# Patient Record
Sex: Female | Born: 1960 | Race: White | Hispanic: No | Marital: Married | Smoking: Never smoker
Health system: Southern US, Community
[De-identification: ages and names within clinical notes are randomized; demographics above are authoritative.]

## PROBLEM LIST (undated history)

## (undated) DIAGNOSIS — E2681 Bartter's syndrome: Secondary | ICD-10-CM

## (undated) DIAGNOSIS — K219 Gastro-esophageal reflux disease without esophagitis: Secondary | ICD-10-CM

## (undated) DIAGNOSIS — E119 Type 2 diabetes mellitus without complications: Secondary | ICD-10-CM

## (undated) DIAGNOSIS — G4700 Insomnia, unspecified: Secondary | ICD-10-CM

## (undated) DIAGNOSIS — R32 Unspecified urinary incontinence: Secondary | ICD-10-CM

## (undated) DIAGNOSIS — M5431 Sciatica, right side: Secondary | ICD-10-CM

## (undated) DIAGNOSIS — M5432 Sciatica, left side: Secondary | ICD-10-CM

## (undated) DIAGNOSIS — G894 Chronic pain syndrome: Secondary | ICD-10-CM

## (undated) DIAGNOSIS — F32A Depression, unspecified: Secondary | ICD-10-CM

## (undated) DIAGNOSIS — E785 Hyperlipidemia, unspecified: Secondary | ICD-10-CM

## (undated) DIAGNOSIS — M1990 Unspecified osteoarthritis, unspecified site: Secondary | ICD-10-CM

## (undated) HISTORY — PX: SPINAL FUSION: SHX223

---

## 2016-10-07 DIAGNOSIS — M4807 Spinal stenosis, lumbosacral region: Secondary | ICD-10-CM

## 2016-10-07 HISTORY — DX: Spinal stenosis, lumbosacral region: M48.07

## 2020-02-27 ENCOUNTER — Emergency Department
Admission: EM | Admit: 2020-02-27 | Discharge: 2020-02-28 | Disposition: A | Discharge status: Home / Self Care | Payer: 59 | Attending: Emergency Medicine | Admitting: Emergency Medicine

## 2020-02-27 ENCOUNTER — Emergency Department: Payer: 59

## 2020-02-27 ENCOUNTER — Other Ambulatory Visit: Payer: Self-pay

## 2020-02-27 ENCOUNTER — Encounter: Payer: Self-pay | Admitting: *Deleted

## 2020-02-27 DIAGNOSIS — Y92411 Interstate highway as the place of occurrence of the external cause: Secondary | ICD-10-CM | POA: Insufficient documentation

## 2020-02-27 DIAGNOSIS — E119 Type 2 diabetes mellitus without complications: Secondary | ICD-10-CM | POA: Insufficient documentation

## 2020-02-27 DIAGNOSIS — S5012XA Contusion of left forearm, initial encounter: Secondary | ICD-10-CM | POA: Diagnosis not present

## 2020-02-27 DIAGNOSIS — Z23 Encounter for immunization: Secondary | ICD-10-CM | POA: Insufficient documentation

## 2020-02-27 DIAGNOSIS — R0789 Other chest pain: Secondary | ICD-10-CM | POA: Diagnosis present

## 2020-02-27 DIAGNOSIS — T07XXXA Unspecified multiple injuries, initial encounter: Secondary | ICD-10-CM

## 2020-02-27 DIAGNOSIS — M7918 Myalgia, other site: Secondary | ICD-10-CM

## 2020-02-27 DIAGNOSIS — M791 Myalgia, unspecified site: Secondary | ICD-10-CM | POA: Insufficient documentation

## 2020-02-27 DIAGNOSIS — H9313 Tinnitus, bilateral: Secondary | ICD-10-CM | POA: Diagnosis not present

## 2020-02-27 HISTORY — DX: Hyperlipidemia, unspecified: E78.5

## 2020-02-27 HISTORY — DX: Bartter's syndrome: E26.81

## 2020-02-27 HISTORY — DX: Insomnia, unspecified: G47.00

## 2020-02-27 HISTORY — DX: Chronic pain syndrome: G89.4

## 2020-02-27 HISTORY — DX: Type 2 diabetes mellitus without complications: E11.9

## 2020-02-27 HISTORY — DX: Gastro-esophageal reflux disease without esophagitis: K21.9

## 2020-02-27 HISTORY — DX: Sciatica, left side: M54.32

## 2020-02-27 HISTORY — DX: Sciatica, right side: M54.31

## 2020-02-27 HISTORY — DX: Unspecified urinary incontinence: R32

## 2020-02-27 HISTORY — DX: Depression, unspecified: F32.A

## 2020-02-27 HISTORY — DX: Unspecified osteoarthritis, unspecified site: M19.90

## 2020-02-27 LAB — CBC
HCT: 37.4 % (ref 36.0–46.0)
Hemoglobin: 12 g/dL (ref 12.0–15.0)
MCH: 27.6 pg (ref 26.0–34.0)
MCHC: 32.1 g/dL (ref 30.0–36.0)
MCV: 86.2 fL (ref 80.0–100.0)
Platelets: 303 10*3/uL (ref 150–400)
RBC: 4.34 MIL/uL (ref 3.87–5.11)
RDW: 14.2 % (ref 11.5–15.5)
WBC: 11.6 10*3/uL — ABNORMAL HIGH (ref 4.0–10.5)
nRBC: 0 % (ref 0.0–0.2)

## 2020-02-27 LAB — BASIC METABOLIC PANEL
Anion gap: 12 (ref 5–15)
BUN: 13 mg/dL (ref 6–20)
CO2: 27 mmol/L (ref 22–32)
Calcium: 8.4 mg/dL — ABNORMAL LOW (ref 8.9–10.3)
Chloride: 101 mmol/L (ref 98–111)
Creatinine, Ser: 0.8 mg/dL (ref 0.44–1.00)
GFR, Estimated: >60 mL/min (ref >60)
Glucose, Bld: 122 mg/dL — ABNORMAL HIGH (ref 70–99)
Potassium: 4.2 mmol/L (ref 3.5–5.1)
Sodium: 140 mmol/L (ref 135–145)

## 2020-02-27 LAB — TROPONIN I (HIGH SENSITIVITY)
Troponin I (High Sensitivity): 3 ng/L (ref <18)
Troponin I (High Sensitivity): 3 ng/L (ref <18)
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
## 2020-02-28 ENCOUNTER — Encounter: Payer: Self-pay | Admitting: Emergency Medicine

## 2020-02-28 MED ORDER — TETANUS-DIPHTH-ACELL PERTUSSIS 5-2.5-18.5 LF-MCG/0.5 IM SUSY
0.5000 mL | PREFILLED_SYRINGE | Freq: Once | INTRAMUSCULAR | Status: AC
  Administered 2020-02-28: 0.5 mL via INTRAMUSCULAR
  Filled 2020-02-28: qty 0.5
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
## 2023-01-28 ENCOUNTER — Ambulatory Visit
Admission: EM | Admit: 2023-01-28 | Discharge: 2023-01-28 | Disposition: A | Discharge status: Home / Self Care | Payer: 59 | Attending: Family Medicine | Admitting: Family Medicine

## 2023-01-28 DIAGNOSIS — J020 Streptococcal pharyngitis: Secondary | ICD-10-CM | POA: Insufficient documentation

## 2023-01-28 LAB — GROUP A STREP BY PCR: Group A Strep by PCR: DETECTED — AB

## 2023-01-28 MED ORDER — FLUCONAZOLE 150 MG PO TABS: 150.0000 mg | ORAL_TABLET | ORAL | 0 refills | Status: AC

## 2023-01-28 MED ORDER — AMOXICILLIN-POT CLAVULANATE 875-125 MG PO TABS: 1.0000 | ORAL_TABLET | Freq: Two times a day (BID) | ORAL | 0 refills | Status: AC

## 2023-01-28 NOTE — ED Triage Notes (Addendum)
Pt presents to UC c/o sore throat, congestion onset yesterday morning, pt has been taking nyquil for sxs. Pt thinks she has had a fever, c/o hurting to swallow. Pt states she did have covid x3 weeks ago.

## 2023-01-28 NOTE — ED Provider Notes (Signed)
[facility]    CSN: [id number] Arrival date & time: 01/28/23  1245      History   Chief Complaint Chief Complaint  Patient presents with   Sore Throat   Nasal Congestion    HPI Jamie Sanders is a 62 y.o. female.   HPI  History obtained from the patient. Jamie Sanders presents for sore throat, rhinorrhea, painful swallowing, headache,  cough, and nasal congestion that started yesterday morning. Denies vomiting, diarrhea, fever.  Tried NyQuil to help her sleep. Her brother also has a sore throat.  She recently traveled by plane. Had COVID 3 weeks ago.        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   Past Surgical History:  Procedure Laterality Date   SPINAL FUSION      OB History   No obstetric history on file.      Home Medications    Prior to Admission medications   Medication Sig Start Date End Date Taking? Authorizing Provider  amoxicillin-clavulanate (AUGMENTIN) 875-125 MG tablet Take 1 tablet by mouth every 12 (twelve) hours. 01/28/23  Yes Kristoffer Bala, DO  atorvastatin (LIPITOR) 20 MG tablet Take 1 tablet by mouth daily. 01/02/23  Yes [provider]  celecoxib (CELEBREX) 100 MG capsule Take 1 capsule by mouth 2 (two) times daily. 01/02/23  Yes [provider]  DULoxetine (CYMBALTA) 60 MG capsule Take 120 mg by mouth at bedtime.   Yes [provider]  fluconazole (DIFLUCAN) 150 MG tablet Take 1 tablet (150 mg total) by mouth every 3 (three) days for 2 doses. 01/28/23 02/01/23 Yes Lakhia Gengler, DO  Insulin Syringe-Needle U-100 (GLOBAL INJECT  EASE INSULIN SYR) 30G X 1/2" 1 ML MISC See admin instructions. 05/01/20  Yes [provider]  lisinopril (ZESTRIL) 20 MG tablet Take 1 tablet by mouth daily. 01/02/23  Yes [provider]  metFORMIN (GLUCOPHAGE) 1000 MG tablet Take 1 tablet by mouth 2 (two) times daily with a meal. 01/02/23  Yes [provider]  MOUNJARO 10 MG/0.5ML Pen SMARTSIG:10 Milligram(s) SUB-Q Once a Week 10/23/22  Yes [provider]  omeprazole (PRILOSEC) 20 MG capsule Take by mouth. 06/24/22 06/24/23 Yes [provider]  SEMGLEE, YFGN, 100 UNIT/ML injection Inject into the skin. 12/16/22 12/16/23 Yes [provider]  traZODone (DESYREL) 50 MG tablet Take by mouth. 01/02/23  Yes [provider]    Family History No family history on file.  Social History Social History   Tobacco Use   Smoking status: Never   Smokeless tobacco: Never  Substance Use Topics   Alcohol use: Not Currently   Drug use: Not Currently     Allergies   Codeine   Review of Systems Review of Systems: negative unless otherwise stated in HPI.      Physical Exam Triage Vital Signs ED Triage Vitals  Encounter Vitals Group     BP 01/28/23 1350 (!) 141/72     Systolic BP Percentile --      Diastolic BP Percentile --  Pulse Rate 01/28/23 1350 100     Resp --      Temp 01/28/23 1350 98.5 F (36.9 C)     Temp Source 01/28/23 1350 Oral     SpO2 01/28/23 1350 97 %     Weight --      Height --      Head Circumference --      Peak Flow --      Pain Score 01/28/23 1349 9     Pain Loc --      Pain Education --      Exclude from Growth Chart --    No data found.  Updated Vital Signs BP (!) 141/72 (BP Location: Left Arm)   Pulse 100   Temp 98.5 F (36.9 C) (Oral)   SpO2 97%   Visual Acuity Right Eye Distance:   Left Eye Distance:   Bilateral Distance:    Right Eye Near:   Left Eye Near:    Bilateral Near:     Physical Exam GEN:     alert, ill but non-toxic  appearing female in no distress    HENT:  mucus membranes moist, oropharyngeal without lesions, moderate erythema, + tonsillar hypertrophy but no visible exudates, clear nasal discharge EYES:   pupils equal and reactive, no scleral injection or discharge NECK:  normal ROM, +lymphadenopathy, no meningismus   RESP:  no increased work of breathing, clear to auscultation bilaterally CVS:   regular rate and rhythm Skin:   warm and dry, no rash on visible skin    UC Treatments / Results  Labs (all labs ordered are listed, but only abnormal results are displayed) Labs Reviewed  GROUP A STREP BY PCR - Abnormal; Notable for the following components:      Result Value   Group A Strep by PCR DETECTED (*)    All other components within normal limits    EKG   Radiology No results found.  Procedures Procedures (including critical care time)  Medications Ordered in UC Medications - No data to display  Initial Impression / Assessment and Plan / UC Course  I have reviewed the triage vital signs and the nursing notes.  Pertinent labs & imaging results that were available during my care of the patient were reviewed by me and considered in my medical decision making (see chart for details).       Pt is a 62 y.o. female who presents for 1-2  days of respiratory symptoms. Royce is afebrile here without recent antipyretics. Satting well on room air. Overall pt is ill but non-toxic appearing, well hydrated, without respiratory distress. Pulmonary exam is unremarkable.  She has oropharyngeal erythema without exudates but does have some tonsillar hypertrophy.  COVID testing deferred as she recently had COVID.  Strep PCR obtained and is positive. Treat with Augmentin twice daily for 10 days.  She has history of antibiotic associated yeast infections therefore Diflucan was sent in as well.  Discussed symptomatic treatment.  Typical duration of symptoms discussed.   Return and ED precautions given and  voiced understanding. Discussed MDM, treatment plan and plan for follow-up with patient who agrees with plan.     Final Clinical Impressions(s) / UC Diagnoses   Final diagnoses:  Strep pharyngitis     Discharge Instructions      Your strep test is positive.  Stop by the pharmacy to pick up your antibiotics.      ED Prescriptions     Medication Sig Dispense Auth. Provider  amoxicillin-clavulanate (AUGMENTIN) 875-125 MG tablet Take 1 tablet by mouth every 12 (twelve) hours. 20 tablet Eder Macek, DO   fluconazole (DIFLUCAN) 150 MG tablet Take 1 tablet (150 mg total) by mouth every 3 (three) days for 2 doses. 2 tablet Katha Cabal, DO      PDMP not reviewed this encounter.   Katha Cabal, DO 01/28/23 1440

## 2023-01-28 NOTE — Discharge Instructions (Signed)
Your strep test is positive.  Stop by the pharmacy to pick up your antibiotics.
